# Patient Record
Sex: Male | Born: 1985
Health system: Southern US, Community
[De-identification: ages and names within clinical notes are randomized; demographics above are authoritative.]

---

## 2012-12-31 ENCOUNTER — Encounter: Payer: Self-pay | Admitting: Physician Assistant

## 2012-12-31 ENCOUNTER — Ambulatory Visit (INDEPENDENT_AMBULATORY_CARE_PROVIDER_SITE_OTHER): Payer: 59 | Admitting: Physician Assistant

## 2012-12-31 VITALS — BP 122/74 | HR 64 | Temp 97.9°F | Resp 16 | Ht 68.0 in | Wt 181.0 lb

## 2012-12-31 DIAGNOSIS — M25561 Pain in right knee: Secondary | ICD-10-CM

## 2012-12-31 DIAGNOSIS — M25569 Pain in unspecified knee: Secondary | ICD-10-CM

## 2012-12-31 NOTE — Progress Notes (Signed)
   Patient ID: Troy Glover MRN: [id number], DOB: [birth year], 27 y.o. Date of Encounter: 12/31/2012, 5:33 PM    Chief Complaint:  Chief Complaint  Patient presents with  . Knee Pain    boat injury over weekend     HPI: 27 y.o. year old male says that Friday night (12/28/12) he was sitting on a boat. He had his right leg stretched out straight in front of him. His friend fell onto his knee-his friend was standing at medial aspect of pts leg. Since then he has felt a little "something" at lateral aspect of knee. Only notices it when he goes up and down steps. Has no pain or abnormal sensation when wlking on flat surface. No locking up or giving way.  Work requires walking on flat floor-no climbing, stairs, etc. No lifting. He usually does elliptical for exercise but no other sport/activity.   Home Meds: No current outpatient prescriptions on file prior to visit.   No current facility-administered medications on file prior to visit.    Allergies: No Known Allergies    Review of Systems: See HPI for pertinent ROS  Physical Exam: Blood pressure 122/74, pulse 64, temperature 97.9 F (36.6 C), temperature source Oral, resp. rate 16, height 5\' 8"  (1.727 m), weight 181 lb (82.101 kg)., Body mass index is 27.53 kg/(m^2). General: Well developed, well nourished, in no acute distress. Neck: Supple. No thyromegaly. Full ROM. No lymphadenopathy. Lungs: Clear bilaterally to auscultation without wheezes, rales, or rhonchi. Breathing is unlabored. Heart: RRR  No murmurs, rubs, or gallops. Msk:  Strength and tone normal for age. Extremities/Skin: Right Knee: Inspection is normal with no erythema, edema, effusion. Entire knee palpated. The only area with any tenderness at all is at medial aspect-just superior to level of joint line. And it is very slightly tender. No pain or laxity with valgus/varus. Negative Anterior Drawer. Neuro: Alert and oriented X 3. Moves all extremities spontaneously. Gait  is normal. CNII-XII grossly in tact. Psych:  Responds to questions appropriately with a normal affect.   ASSESSMENT AND PLAN:  27 y.o. year old male with  1. Knee pain, right I recommend he wear a knee sleeve. Rest the knee as much as possible. Ice and elevate knee at end of day. If worsens or not resolved in 2 weeks f/u and consider ortho referral.    Signed, Frazier Richards, [place], [facility] 12/31/2012 5:33 PM

## 2013-02-14 ENCOUNTER — Ambulatory Visit (INDEPENDENT_AMBULATORY_CARE_PROVIDER_SITE_OTHER): Payer: 59 | Admitting: Physician Assistant

## 2013-02-14 ENCOUNTER — Encounter: Payer: Self-pay | Admitting: Physician Assistant

## 2013-02-14 VITALS — BP 128/74 | HR 68 | Temp 97.0°F | Resp 16 | Ht 68.5 in | Wt 181.0 lb

## 2013-02-14 DIAGNOSIS — H6590 Unspecified nonsuppurative otitis media, unspecified ear: Secondary | ICD-10-CM

## 2013-02-14 DIAGNOSIS — R599 Enlarged lymph nodes, unspecified: Secondary | ICD-10-CM

## 2013-02-14 DIAGNOSIS — H6592 Unspecified nonsuppurative otitis media, left ear: Secondary | ICD-10-CM

## 2013-02-14 MED ORDER — AMOXICILLIN-POT CLAVULANATE 875-125 MG PO TABS: 1.0000 | ORAL_TABLET | Freq: Two times a day (BID) | ORAL | Status: DC

## 2013-02-14 NOTE — Progress Notes (Signed)
Patient ID: Troy Glover MRN: [id number], DOB: [birth year], 27 y.o. Date of Encounter: 02/14/2013, 4:11 PM    Chief Complaint:  Chief Complaint  Patient presents with  . lump behind left ear    x 2 mths     HPI: 27 y.o. year old white male presents for evaluation of "mass/possible enlarged lymph node" behind left ear. First noticed it about 2 months ago. Has never been tender. Has remained about the same size. Has had no ear pain, tooth pain, sores in mouth, sores on scalp. No fever, no cats at home, and no cat scratch. Has not noticed any other enlarged lymph nodes. Feels fine o/w. No increased fatigue or malaise. No fever/chills.  Home Meds: See attached medication section for any medications that were entered at today's visit. The computer does not put those onto this list.The following list is a list of meds entered prior to today's visit.   No current outpatient prescriptions on file prior to visit.   No current facility-administered medications on file prior to visit.    Allergies: No Known Allergies    Review of Systems: See HPI for pertinent ROS. All other ROS negative.    Physical Exam: Blood pressure 128/74, pulse 68, temperature 97 F (36.1 C), temperature source Oral, resp. rate 16, height 5' 8.5" (1.74 m), weight 181 lb (82.101 kg)., Body mass index is 27.12 kg/(m^2). General: WNWD WM. Appears in no acute distress. HEENT: Normocephalic, atraumatic, eyes without discharge, sclera non-icteric, nares are without discharge. Bilateral auditory canals clear, TM's are without perforation. Right TM is pearly grey and translucent with reflective cone of light. Left TM has area of red erythema. Inferior half is dull and golden. Different appearance than the right TM, which is perfectly clear.  Oral cavity moist, posterior pharynx without exudate, erythema, peritonsillar abscess, or post nasal drip.No lesions on oral mucosa.   Neck: Supple. No thyromegaly.  LYMPH NODES: Left  PostAuricular Lymph node is approx 1 cm diameter. Firm. There is no palpable right postauricular lymph node.  Right Tonsillar node is </= to 1 cm. Left Tonsillar node is normal and not prominent.  Right Submental node is < 1 cm but is more prominent than the one on the left, which is not palpable.  Axilla: Bilateral Axilla palpated and no lymph nodes are palpable.  Groin: Bilateral Groin are palpated. No enlarged lymph nodes palpable.  Lungs: Clear bilaterally to auscultation without wheezes, rales, or rhonchi. Breathing is unlabored. Heart: Regular rhythm. No murmurs, rubs, or gallops. Msk:  Strength and tone normal for age. Neuro: Alert and oriented X 3. Moves all extremities spontaneously. Gait is normal. CNII-XII grossly in tact. Psych:  Responds to questions appropriately with a normal affect.     ASSESSMENT AND PLAN:  27 y.o. year old male with  1. Enlarged lymph node - amoxicillin-clavulanate (AUGMENTIN) 875-125 MG per tablet; Take 1 tablet by mouth 2 (two) times daily.  Dispense: 20 tablet; Refill: 0 - CBC with Differential - Sedimentation rate  2. Left otitis media with effusion - amoxicillin-clavulanate (AUGMENTIN) 875-125 MG per tablet; Take 1 tablet by mouth 2 (two) times daily.  Dispense: 20 tablet; Refill: 0  I really think the enlarge lymph node is secondary to ear infection. However, he wants to go ahead and have blood work while he is here. Will f/u lab results. Complete all of abx. If lymph node size does not decrease by completion of antibiotics, then f/u.   Signed, Shon Hale [place], [place], [place]  02/14/2013 4:11 PM

## 2013-02-15 LAB — CBC WITH DIFFERENTIAL/PLATELET
Basophils Relative: 0 % (ref 0–1)
Eosinophils Absolute: 0.1 10*3/uL (ref 0.0–0.7)
Eosinophils Relative: 2 % (ref 0–5)
HCT: 40.4 % (ref 39.0–52.0)
Hemoglobin: 13.9 g/dL (ref 13.0–17.0)
MCH: 28.8 pg (ref 26.0–34.0)
MCHC: 34.4 g/dL (ref 30.0–36.0)
MCV: 83.8 fL (ref 78.0–100.0)
Monocytes Absolute: 0.4 10*3/uL (ref 0.1–1.0)
Monocytes Relative: 7 % (ref 3–12)

## 2013-03-04 ENCOUNTER — Telehealth: Payer: Self-pay | Admitting: Physician Assistant

## 2013-03-04 NOTE — Telephone Encounter (Signed)
Patient was called and appt made for Wednesday.

## 2013-03-04 NOTE — Telephone Encounter (Signed)
Having not felt the mass, I would like to see him prior to ordering an [place].

## 2013-03-06 ENCOUNTER — Encounter: Payer: Self-pay | Admitting: Family Medicine

## 2013-03-06 ENCOUNTER — Ambulatory Visit (INDEPENDENT_AMBULATORY_CARE_PROVIDER_SITE_OTHER): Payer: 59 | Admitting: Family Medicine

## 2013-03-06 VITALS — BP 110/72 | HR 70 | Temp 98.1°F | Resp 18 | Ht 69.0 in | Wt 182.0 lb

## 2013-03-06 DIAGNOSIS — R599 Enlarged lymph nodes, unspecified: Secondary | ICD-10-CM

## 2013-03-06 NOTE — Assessment & Plan Note (Signed)
Enlarged node, not responsive to antibiotics , obtain Ultrasound Other differentials cyst, bony growth

## 2013-03-06 NOTE — Progress Notes (Signed)
  Subjective:    Patient ID: Troy Glover, male    DOB: 11/10/1985, 27 y.o.   MRN: [id number]  HPI  Pt here to f/u left sided neck mass. Seen in May prescribed antibiotics, for enlarged lymph node, took 10 days Amox, no improvement. Node is non tender, pt this it may be a little bigger in size.  No family history of lymphoma or other neck mass/cancer Review of Systems - per above  GEN- denies fatigue, fever, weight loss,weakness, recent illness HEENT- denies eye drainage, change in vision, nasal discharge, Neuro- denies headache, dizziness, syncope, seizure activity       Objective:   Physical Exam   GEN- NAD, alert and oriented x3 HEENT- PERRL, EOMI, non injected sclera, pink conjunctiva, MMM, oropharynx clear, TM clear bilat no effusion, no maxillary sinus tenderness,  Neck- Supple, Left post auricular 1cm node firm, non mobile, non fluctuant, shotty submandibular node, no post auricular node right side          Assessment & Plan:

## 2013-03-06 NOTE — Patient Instructions (Signed)
Ultrasound to be done No further meds needed

## 2013-03-07 ENCOUNTER — Encounter: Payer: Self-pay | Admitting: Family Medicine

## 2013-03-11 ENCOUNTER — Ambulatory Visit
Admission: RE | Admit: 2013-03-11 | Discharge: 2013-03-11 | Disposition: A | Discharge status: Home / Self Care | Payer: 59 | Source: Ambulatory Visit | Attending: Family Medicine | Admitting: Family Medicine

## 2013-03-11 DIAGNOSIS — R599 Enlarged lymph nodes, unspecified: Secondary | ICD-10-CM

## 2013-03-12 ENCOUNTER — Other Ambulatory Visit: Payer: Self-pay | Admitting: Family Medicine

## 2013-03-12 DIAGNOSIS — R590 Localized enlarged lymph nodes: Secondary | ICD-10-CM

## 2013-05-17 ENCOUNTER — Telehealth: Payer: Self-pay | Admitting: Family Medicine

## 2013-05-17 NOTE — Telephone Encounter (Signed)
Pt C/O persistent cough x 4 weeks.  Worse in the afternoon.  What would we recommend??  Recommend office visit with provider.  Gave patient appt for Tuesday Morning.  Stated he had old antibiotics at home should he take them??  NO NO NO.  Do Not take them.

## 2013-05-21 ENCOUNTER — Ambulatory Visit: Payer: Self-pay | Admitting: Family Medicine

## 2013-06-03 ENCOUNTER — Ambulatory Visit (INDEPENDENT_AMBULATORY_CARE_PROVIDER_SITE_OTHER): Payer: 59 | Admitting: Family Medicine

## 2013-06-03 DIAGNOSIS — Z23 Encounter for immunization: Secondary | ICD-10-CM

## 2014-12-02 ENCOUNTER — Ambulatory Visit (INDEPENDENT_AMBULATORY_CARE_PROVIDER_SITE_OTHER): Payer: 59 | Admitting: Family Medicine

## 2014-12-02 ENCOUNTER — Encounter: Payer: Self-pay | Admitting: Family Medicine

## 2014-12-02 VITALS — BP 126/74 | HR 66 | Temp 97.4°F | Resp 16 | Ht 69.0 in | Wt 181.0 lb

## 2014-12-02 DIAGNOSIS — R091 Pleurisy: Secondary | ICD-10-CM

## 2014-12-02 NOTE — Progress Notes (Signed)
   Subjective:    Patient ID: Troy Glover, male    DOB: 08/03/1986, 29 y.o.   MRN: [id number]  HPI  Patient symptoms began in January. He developed right-sided pleurisy in his anterior right lung around the fifth rib just below his right nipple. That area is still tender to palpation. It hurts with deep breaths. He denies any night sweats fever or hemoptysis. It also hurts when he stretches over his head. He denies any falls or injuries or inciting events. He does smoke. He denies any weight loss. He denies any other systemic symptoms. He denies any exposure to tuberculosis. No past medical history on file. No past surgical history on file. No current outpatient prescriptions on file prior to visit.   No current facility-administered medications on file prior to visit.   No Known Allergies History   Social History  . Marital Status: Unknown    Spouse Name: N/A  . Number of Children: N/A  . Years of Education: N/A   Occupational History  . Not on file.   Social History Main Topics  . Smoking status: Current Every Day Smoker    Types: Cigarettes  . Smokeless tobacco: Never Used  . Alcohol Use: Yes  . Drug Use: No  . Sexual Activity: Not on file   Other Topics Concern  . Not on file   Social History Narrative     Review of Systems  All other systems reviewed and are negative.      Objective:   Physical Exam  Constitutional: He appears well-developed and well-nourished.  Neck: Neck supple.  Cardiovascular: Normal rate, regular rhythm and normal heart sounds.   Pulmonary/Chest: Effort normal and breath sounds normal. No respiratory distress. He has no wheezes. He has no rales. He exhibits tenderness.  Abdominal: Soft. Bowel sounds are normal.  Lymphadenopathy:    He has no cervical adenopathy.  Vitals reviewed.         Assessment & Plan:  Pleurisy - Plan: DG Chest 2 View, DG Ribs Unilateral Right  I believe the patient has an intercostal muscle strain. That  is why the patient's pain is pleuritic in nature. It also is why it is tender with stretching of his rib cage. There are no abdominal symptoms whatsoever. He has no worrisome findings. I will obtain a chest x-ray as well as x-rays of the ribs to rule out significant underlying pathology and if negative, I will recommend tincture of time and reassured the patient that this will gradually improve over the next 1-2 months.

## 2014-12-04 ENCOUNTER — Ambulatory Visit
Admission: RE | Admit: 2014-12-04 | Discharge: 2014-12-04 | Disposition: A | Discharge status: Home / Self Care | Payer: Self-pay | Source: Ambulatory Visit | Attending: Family Medicine | Admitting: Family Medicine

## 2014-12-04 DIAGNOSIS — R091 Pleurisy: Secondary | ICD-10-CM

## 2016-02-03 ENCOUNTER — Ambulatory Visit (INDEPENDENT_AMBULATORY_CARE_PROVIDER_SITE_OTHER): Payer: 59 | Admitting: Family Medicine

## 2016-02-03 ENCOUNTER — Encounter: Payer: Self-pay | Admitting: Family Medicine

## 2016-02-03 VITALS — BP 128/72 | HR 78 | Temp 98.5°F | Resp 12 | Ht 69.0 in | Wt 183.0 lb

## 2016-02-03 DIAGNOSIS — G5621 Lesion of ulnar nerve, right upper limb: Secondary | ICD-10-CM | POA: Diagnosis not present

## 2016-02-03 MED ORDER — MELOXICAM 7.5 MG PO TABS: 7.5000 mg | ORAL_TABLET | Freq: Every day | ORAL | Status: DC

## 2016-02-03 NOTE — Patient Instructions (Addendum)
Take the mobic once a day for 2 weeks  Use the brace  Call if no improvement with brace in 4 weeks and ortho referral will be made F/U as needed

## 2016-02-03 NOTE — Progress Notes (Signed)
Patient ID: Troy Glover, male   DOB: 08/03/1986, 30 y.o.   MRN: [id number]   Subjective:    Patient ID: Troy Glover, male    DOB: 06/28/1986, 30 y.o.   MRN: [id number]  Patient presents for R Hand Pain Patient here with right hand pain for the past couple months this has been worsening. He notices discomfort as well as a change in sensation on his fourth and fifth digits when he is using his computer.His fingers will actually feel cold and he keeps a heater near by.  He occasionally gets a numb sensation as well. On occasion he has some pain at his elbow but typically is mostly at his wrist and into his hand. He also feels like his fingers a little bit weaker on the fourth and fifth digits. He has not tried anything over-the-counter for any discomfort.    Review Of Systems:  GEN- denies fatigue, fever, weight loss,weakness, recent illness HEENT- denies eye drainage, change in vision, nasal discharge, CVS- denies chest pain, palpitations RESP- denies SOB, cough, wheeze ABD- denies N/V, change in stools, abd pain GU- denies dysuria, hematuria, dribbling, incontinence MSK- denies joint pain, muscle aches, injury Neuro- denies headache, dizziness, syncope, seizure activity       Objective:    BP 128/72 mmHg  Pulse 78  Temp(Src) 98.5 F (36.9 C) (Oral)  Resp 12  Ht 5\' 9"  (1.753 m)  Wt 183 lb (83.008 kg)  BMI 27.01 kg/m2 GEN- NAD, alert and oriented x3 Neck- supple, FROM NEURO-CNII-XII intact, strength equal bilat UE, neg tinels, normal grasp bilat Decrease sensation over lateral aspect of right fith digit and palmer surface of 4th and 5th digits, FROM of digits MSK NT at elbow/epicondyles, FROM Wrist, shoulder        Assessment & Plan:      Problem List Items Addressed This Visit    None    Visit Diagnoses    Ulnar neuropathy at wrist, right    -  Primary    Wrist brace given to help relieve nerve with working, activites, given Mobic for 2 weeks. if no improvement in 4  weeks with bracing ortho referral       Note: This dictation was prepared with Dragon dictation along with smaller phrase technology. Any transcriptional errors that result from this process are unintentional.

## 2016-03-15 IMAGING — CR DG RIBS 2V*R*
3 series · 3 of 3 positions shown · non-contrast
Comparison: None.

CLINICAL DATA: Right pleura.  no injury.

EXAM:
RIGHT RIBS - 2 VIEW

[w ribs ap/pa upper right *]
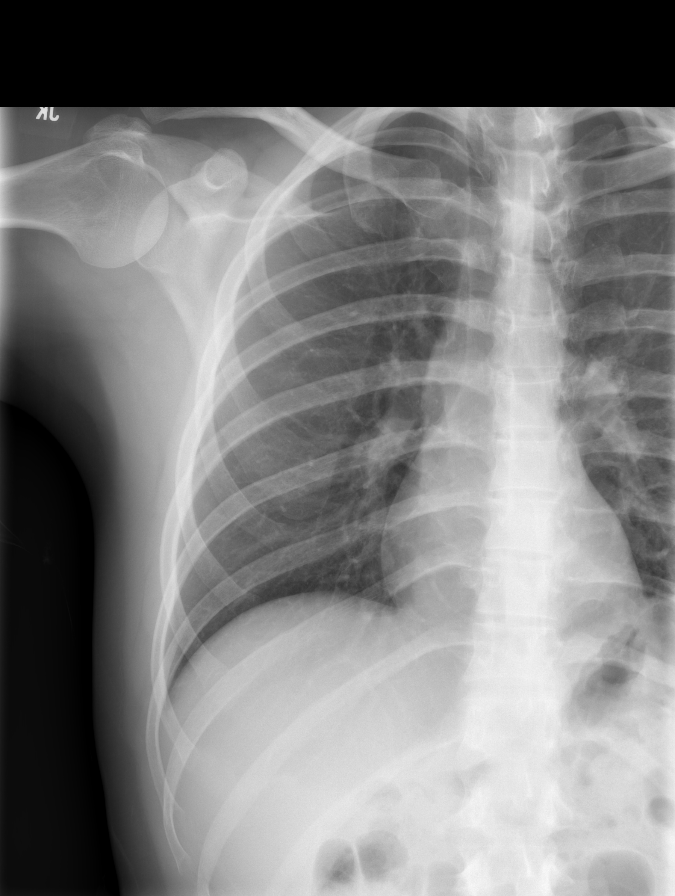

[w ribs oblique right *]
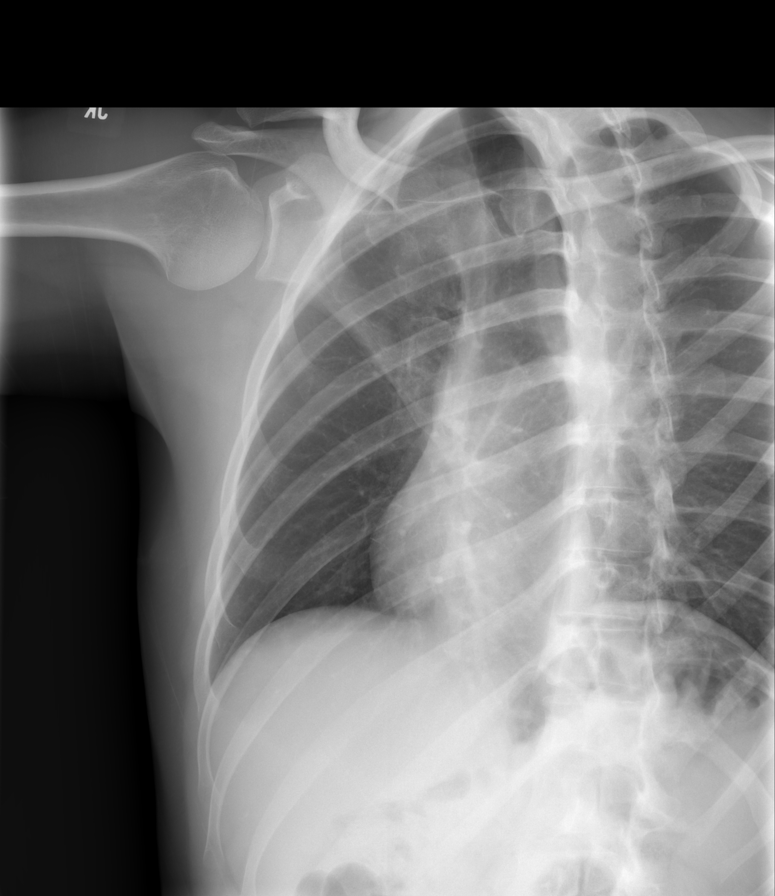

[w ribs ap/pa lower right *]
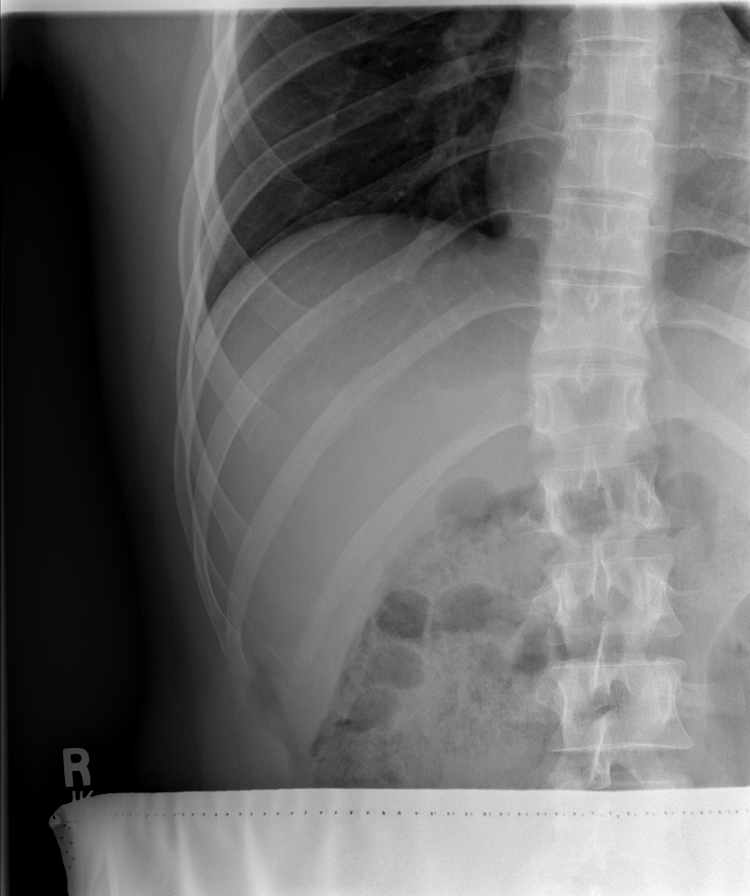

[3 of 3 positions shown; findings below may reference images not displayed]

FINDINGS: No fracture or other bone lesions are seen involving the ribs.
IMPRESSION: Negative.

## 2016-03-15 IMAGING — CR DG CHEST 2V
2 series · 2 of 2 positions shown · non-contrast
Comparison: None.

CLINICAL DATA: Right pleurisy

EXAM:
CHEST  2 VIEW

[w chest pa]
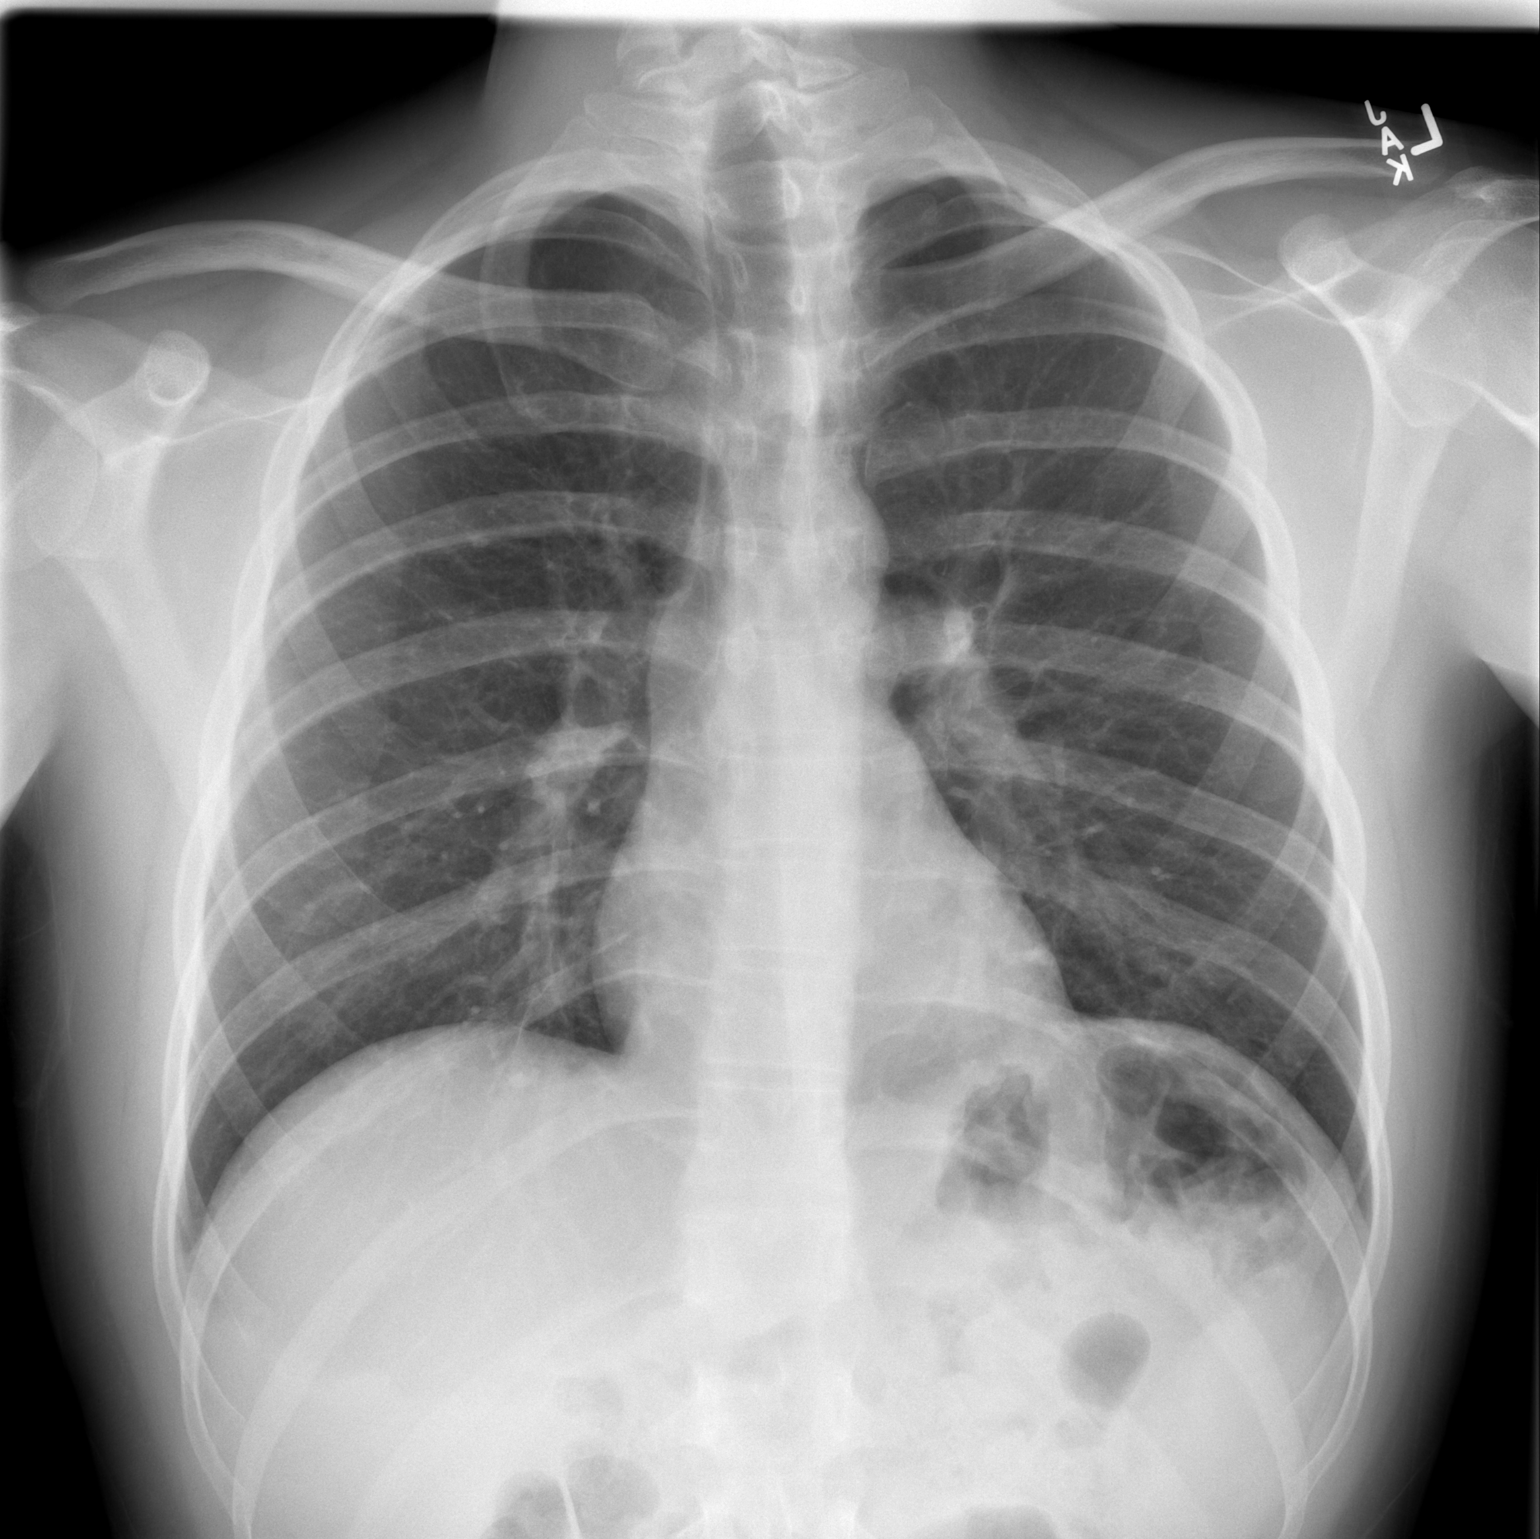

[w chest lat]
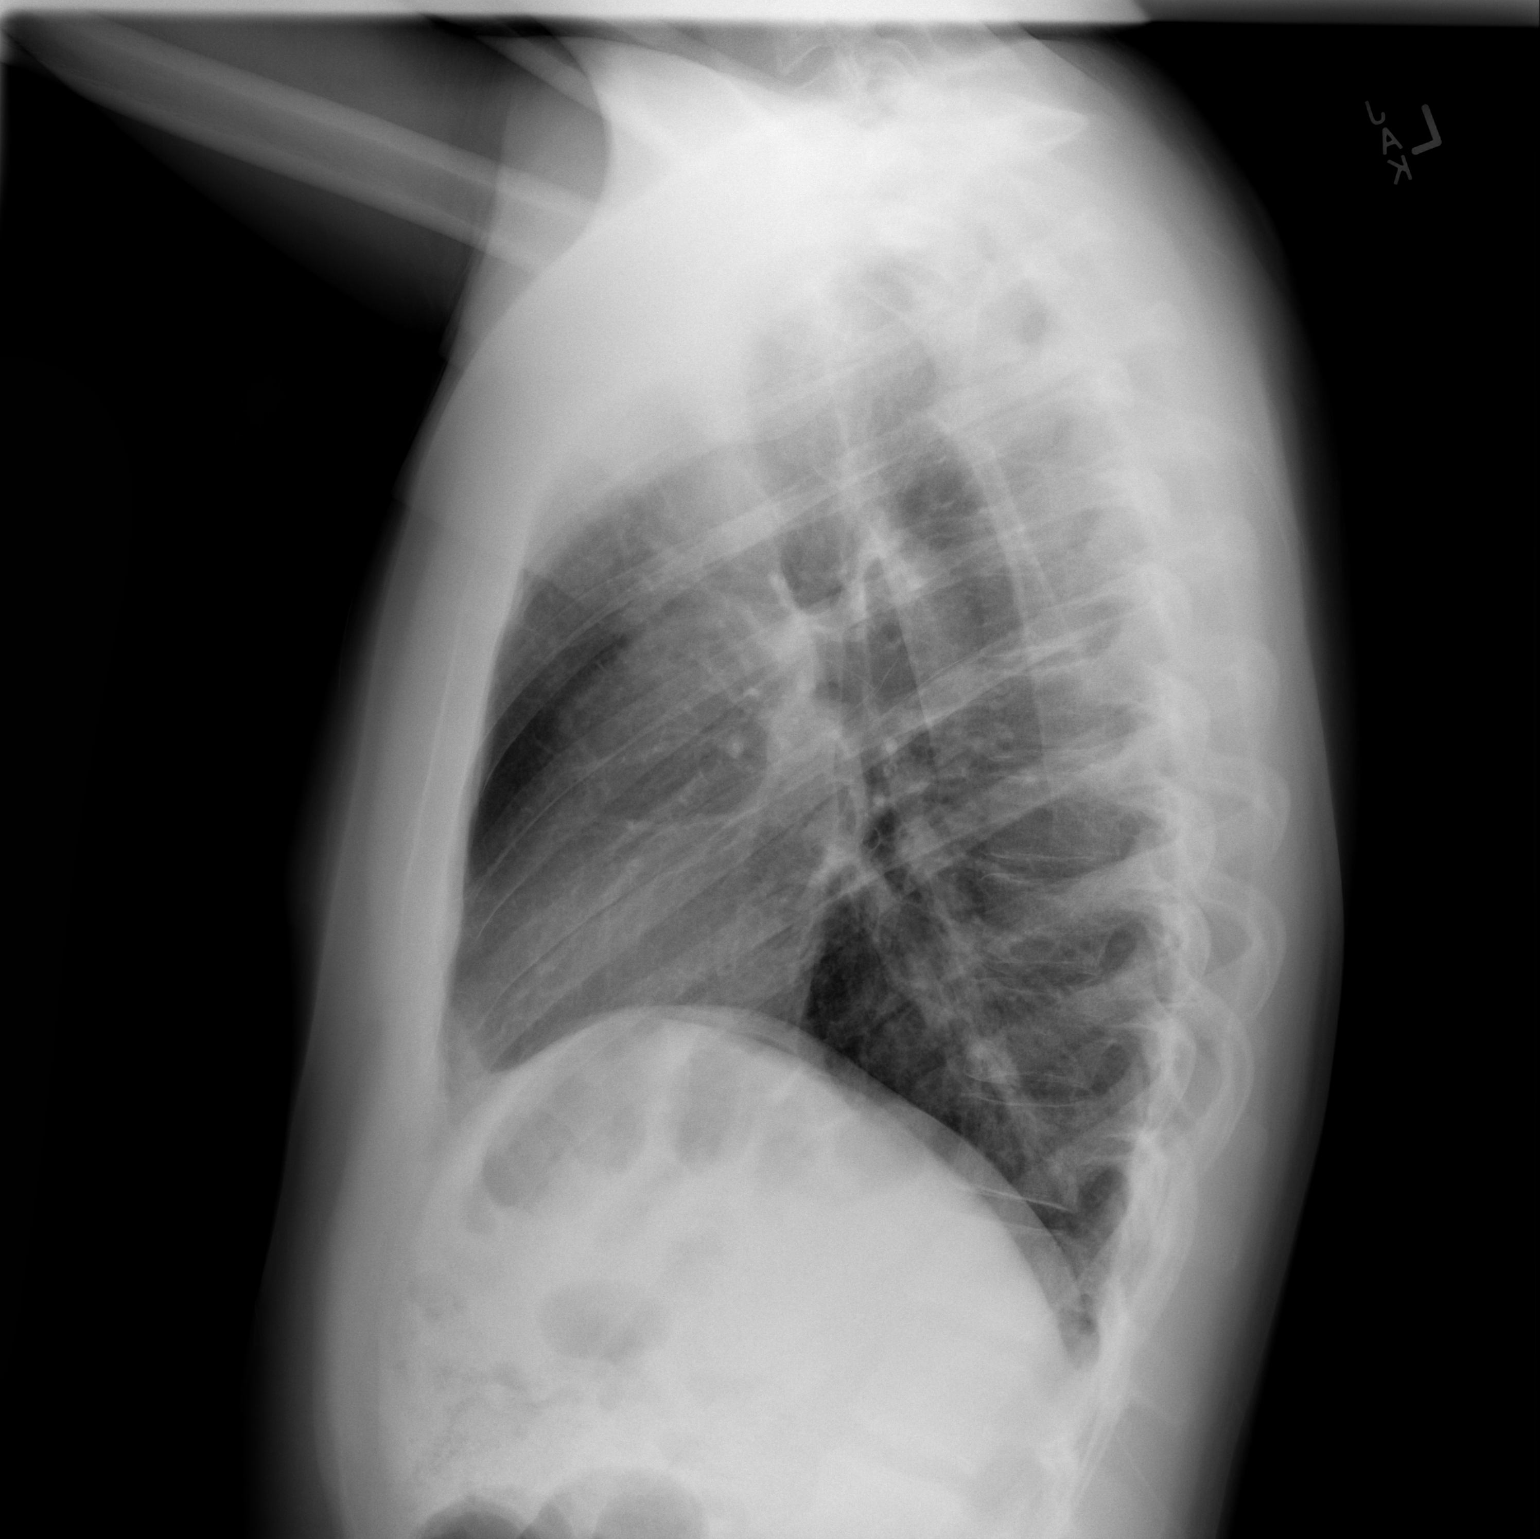

[2 of 2 positions shown; findings below may reference images not displayed]

FINDINGS: The heart size and mediastinal contours are within normal limits.
Both lungs are clear. The visualized skeletal structures are
unremarkable.
IMPRESSION: No active cardiopulmonary disease.

## 2016-12-05 ENCOUNTER — Ambulatory Visit (INDEPENDENT_AMBULATORY_CARE_PROVIDER_SITE_OTHER): Payer: 59 | Admitting: Family Medicine

## 2016-12-05 ENCOUNTER — Encounter: Payer: Self-pay | Admitting: Family Medicine

## 2016-12-05 VITALS — BP 110/68 | HR 74 | Temp 98.0°F | Resp 16 | Ht 69.0 in | Wt 180.0 lb

## 2016-12-05 DIAGNOSIS — R05 Cough: Secondary | ICD-10-CM | POA: Diagnosis not present

## 2016-12-05 DIAGNOSIS — R059 Cough, unspecified: Secondary | ICD-10-CM

## 2016-12-05 DIAGNOSIS — J40 Bronchitis, not specified as acute or chronic: Secondary | ICD-10-CM

## 2016-12-05 MED ORDER — AZITHROMYCIN 250 MG PO TABS: ORAL_TABLET | ORAL | 0 refills | Status: DC

## 2016-12-05 MED ORDER — HYDROCODONE-HOMATROPINE 5-1.5 MG/5ML PO SYRP: 5.0000 mL | ORAL_SOLUTION | Freq: Three times a day (TID) | ORAL | 0 refills | Status: DC | PRN

## 2016-12-05 NOTE — Progress Notes (Signed)
   Subjective:    Patient ID: Troy Glover, male    DOB: 08/31/1986, 31 y.o.   MRN: [id number]  HPI 2 weeks ago, the patient developed an upper respiratory infection with rhinorrhea and head congestion which he treated symptomatically using Mucinex and Sudafed. The cold gradually got better. However over the last 3 days he has had a sudden worsening bruise developed a persistent hacking cough that will not stop. In fact he is coughing so hard he felt he was going pass out at times. He denies any hemoptysis. He denies any chest pain. He denies any fever. He does report some mild pleurisy due to coughing. He cannot stop coughing during our encounter No past medical history on file. No past surgical history on file. No current outpatient prescriptions on file prior to visit.   No current facility-administered medications on file prior to visit.    No Known Allergies Social History   Social History  . Marital status: Unknown    Spouse name: N/A  . Number of children: N/A  . Years of education: N/A   Occupational History  . Not on file.   Social History Main Topics  . Smoking status: Current Every Day Smoker    Types: Cigarettes  . Smokeless tobacco: Never Used  . Alcohol use Yes  . Drug use: No  . Sexual activity: Not on file   Other Topics Concern  . Not on file   Social History Narrative  . No narrative on file      Review of Systems  All other systems reviewed and are negative.      Objective:   Physical Exam  Constitutional: He appears well-developed and well-nourished. No distress.  HENT:  Right Ear: External ear normal.  Left Ear: External ear normal.  Nose: Nose normal.  Mouth/Throat: Oropharynx is clear and moist. No oropharyngeal exudate.  Neck: Neck supple.  Cardiovascular: Normal rate, regular rhythm and normal heart sounds.   No murmur heard. Pulmonary/Chest: Effort normal and breath sounds normal. No respiratory distress. He has no wheezes. He has no  rales. He exhibits no tenderness.  Lymphadenopathy:    He has no cervical adenopathy.  Skin: He is not diaphoretic.  Vitals reviewed.         Assessment & Plan:  Cough - Plan: DG Chest 2 View  Bronchitis - Plan: azithromycin (ZITHROMAX) 250 MG tablet, HYDROcodone-homatropine (HYCODAN) 5-1.5 MG/5ML syrup  I believe the patient had a viral upper respiratory infection may have developed a secondary bronchitis. I will prescribe the patient a Z-Pak. Use Hycodan 1 teaspoon every 6 hours as needed for cough. Proceed with chest x-ray if cough is not better after 1 week or sooner if worse

## 2017-04-27 ENCOUNTER — Telehealth: Payer: Self-pay | Admitting: Physician Assistant

## 2017-04-27 ENCOUNTER — Encounter: Payer: Self-pay | Admitting: Physician Assistant

## 2017-04-27 NOTE — Telephone Encounter (Signed)
PLS see note below and advise

## 2017-04-27 NOTE — Telephone Encounter (Signed)
Yes I will complete FMLA paperwork. He does not have to schedule OV. Tell him that I am very sorry this has happened and am thinking of them.

## 2017-04-27 NOTE — Telephone Encounter (Signed)
Pt called in stating that his wife Troy Glover just lost their baby she was [redacted] weeks pregnant and needs to know if we can fill out FMLA forms for him. The will be out of work for the next 30 days for grievance and to be with her. He doesn't mind coming in for an appointment if he has to but his work states that it has to be filled out by his PCP office. From what I can see it looks like [facility] is listed as his pcp. Please let me know if I need to schedule him an appointment.

## 2017-04-28 NOTE — Telephone Encounter (Signed)
Spoke with Troy Glover and he is aware that we will fill out forms., patient states he will bring forms in on 8/13 and that the company that handles their disabliity forms by the name of [employer] will be calling our office to gather information

## 2017-05-01 ENCOUNTER — Ambulatory Visit (INDEPENDENT_AMBULATORY_CARE_PROVIDER_SITE_OTHER): Payer: 59 | Admitting: Physician Assistant

## 2017-05-01 ENCOUNTER — Encounter: Payer: Self-pay | Admitting: Physician Assistant

## 2017-05-01 VITALS — BP 110/78 | HR 84 | Temp 97.8°F | Resp 16 | Ht 69.0 in | Wt 184.0 lb

## 2017-05-01 DIAGNOSIS — Z0289 Encounter for other administrative examinations: Secondary | ICD-10-CM

## 2017-05-01 NOTE — Progress Notes (Addendum)
Patient ID: Troy Glover MRN: [id number], DOB: 04/19/1986, 31 y.o. Date of Encounter: 05/01/2017, 12:42 PM    Chief Complaint:  Chief Complaint  Patient presents with  . discuss fmal papers     HPI: 31 y.o. year old male here to discuss need to be out of work.   He had called last week regarding needing FMLA paperwork completed and I had agreed to do this without requiring him to come in for office visit. However, today he comes in to discuss with me further. Also explained that after review of policies for FMLA versus disability decided that the disability was more appropriate for his needs.  It is felt that he needs to be out of work for 30 days. It is felt that he is unable to do work of any kind during this time.  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 His job requires a lot of decisions and disciplinary action and career changes. A lot of stress in making decisions. Does not feel that he is emotionally and mentally able to do this job at this time.  His last day that he worked was 04/26/17. First day of absence from work with 04/27/17. Plan for him to remain out of work through 05/28/17 and return to work 05/29/17.  30 minutes of time was spent in direct conversation with patient.   ADDENDUM ADDED 05/11/2017: When [name] was here for his visit with me on 05/01/17, we discussed that sometimes, secondary to HIPPA and regulations, employers do not have to have subjective information. Therefore, at that visit encounter I did not document all of the information that was disclosed at the time of that visit. However, after his visit on 05/01/17, I received fax from his employer/disability plan stating that they need additional objective documentation. They also sent an attached behavioral health assessment form that I have reviewed, but that form does not apply to him and his situation. Therefore I just called and spoke  with patient on the telephone and asked if it is okay if I go ahead and document the information that we discussed at his visit. He reports that it is okay for me to proceed with documenting the information reviewed and discussed at his visit 05/01/17.  He and his wife have no prior children. His wife is now 31 years old. At time of evaluation on 04/26/2017 she was documented as [redacted] weeks gestation. On 04/26/2017 she presented to maternity admissions with severe abdominal pain. Upon evaluation they were unable to locate fetal heart tone. Bedside ultrasound and assessment was suspicious for placental abruption and intrauterine fetal demise. Emergency C-section was performed. Despite vigorous resuscitation efforts baby could not be revived. Other notes document that it was a preterm infant that appeared lifeless at birth with no heart rate. No heart rate or other response noted despite efforts and resuscitation was discontinued after 15 minutes. Infant's father was informed of this unsuccessful resuscitation attempt.  At his visit with me on 05/01/2017 he reported that his wife's parents were there at the house with them now,  providing support. At that visit he reported that he had been trying to be there to prevent his wife from having to deal with anything that she did not absolutely have to. He reported that they were planning the baby's cremation. He did report that the hospital had talked to them about support grief sessions and they will do 10  of these.  At his visit with me he was concerned that if he returned to work right now, that he would not have taken the time to deal with his emotions etc. and then may not handle things at work appropriately.  As well, he then would not be supplying appropriate support to his wife.  He informed me that he has many people who directly report to him and he has a lot of decisions regarding disciplinary action and career changes.  Reported that these decisions can be  very stressful and he does not feel comfortable making high stress decisions at this time.      Home Meds:   Outpatient Medications Prior to Visit  Medication Sig Dispense Refill  . azithromycin (ZITHROMAX) 250 MG tablet 2 tabs poqday1, 1 tab poqday 2-5 6 tablet 0  . HYDROcodone-homatropine (HYCODAN) 5-1.5 MG/5ML syrup Take 5 mLs by mouth every 8 (eight) hours as needed for cough. 120 mL 0   No facility-administered medications prior to visit.     Allergies: No Known Allergies    Review of Systems: See HPI for pertinent ROS. All other ROS negative.    Physical Exam: Blood pressure 110/78, pulse 84, temperature 97.8 F (36.6 C), temperature source Oral, resp. rate 16, height 5\' 9"  (1.753 m), weight 184 lb (83.5 kg), SpO2 99 %., Body mass index is 27.17 kg/m. General:  WNWD WM. Appears in no acute distress. Neck: Supple. No thyromegaly. No lymphadenopathy. Lungs: Clear bilaterally to auscultation without wheezes, rales, or rhonchi. Breathing is unlabored. Heart: Regular rhythm. No murmurs, rubs, or gallops. Msk:  Strength and tone normal for age. Extremities/Skin: Warm and dry.  Neuro: Alert and oriented X 3. Moves all extremities spontaneously. Gait is normal. CNII-XII grossly in tact. Psych:  Responds to questions appropriately with a normal affect.     ASSESSMENT AND PLAN:  31 y.o. year old male with   1. Encounter to obtain excuse from work His last day that he worked was 04/26/17. First day of absence from work with 04/27/17. Plan for him to remain out of work through 05/28/17 and return to work 05/29/17.    [address] [place], [place], [facility] 05/01/2017 12:42 PM

## 2017-05-05 ENCOUNTER — Telehealth: Payer: Self-pay | Admitting: Physician Assistant

## 2017-05-05 NOTE — Telephone Encounter (Signed)
lvm for patient the forms have been completed and faxed back to [employer]

## 2017-05-05 NOTE — Telephone Encounter (Signed)
New Message  Pt voiced wanting to know if his FMLA paperwork has been complete and pt also sent something with his signature on it.  Please f/u

## 2018-05-08 ENCOUNTER — Encounter: Payer: Self-pay | Admitting: Family Medicine

## 2018-05-08 ENCOUNTER — Ambulatory Visit: Payer: 59 | Admitting: Family Medicine

## 2018-05-08 VITALS — BP 120/70 | HR 71 | Temp 97.9°F | Resp 16 | Wt 190.2 lb

## 2018-05-08 DIAGNOSIS — S39012A Strain of muscle, fascia and tendon of lower back, initial encounter: Secondary | ICD-10-CM | POA: Diagnosis not present

## 2018-05-08 MED ORDER — CYCLOBENZAPRINE HCL 10 MG PO TABS: 10.0000 mg | ORAL_TABLET | Freq: Three times a day (TID) | ORAL | 0 refills | Status: AC | PRN

## 2018-05-08 MED ORDER — PREDNISONE 20 MG PO TABS: 40.0000 mg | ORAL_TABLET | Freq: Every day | ORAL | 0 refills | Status: AC

## 2018-05-08 NOTE — Patient Instructions (Addendum)
Use over the counter NSAIDS - like ibuprofen, aleve, naproxen and tylenol for pain.  Use a heating pad frequently, avoid immobilization.  Do not lift anything below waist weighing more than 10 lbs for the next 1-2 weeks.  Use muscle relaxers for when stiff, tight or having muscle spasms.  Hold and only fill steroids (prednisone) for if you develop worsening and daily back pain radiating down leg with tingling, burning or electrical pain.  If you have any numbness, incontinence of stool or urine, or weakness in legs - go to the ER for evaluation.  Physical therapy referral to help eval and rehab.  Follow up in 1 month   May need imaging or ortho/spine referral if not feeling much better in 1-2 months  Mid-Back Strain A strain is a stretch or tear in a muscle or the strong cords of tissue that attach muscle to bone (tendons). Strains of the mid-back (thoracic spine) occur between the neck and the lower back. A strain occurs when muscles or tendons are torn or are stretched beyond their limits. The muscles may become inflamed, resulting in pain and sudden muscle tightening (spasms). A strain can happen suddenly due to an injury (trauma), or it can develop gradually due to overuse. There are three types of strains:  Grade 1 is a mild strain involving a minor tear of the muscle fibers or tendons. This may cause some pain but no loss of muscle strength.  Grade 2 is a moderate strain involving a partial tear of the muscle fibers or tendons. This causes more severe pain and some loss of muscle strength.  Grade 3 is a severe strain involving a complete tear of the muscle or tendon. This causes severe pain and complete or nearly complete loss of muscle strength.  What are the causes? This condition may be caused by:  Trauma, such as a fall or a hit to the body.  Twisting or overstretching the back. This may result from doing activities that require a lot of energy, such as lifting heavy  objects.  What increases the risk? The following factors may increase your risk of getting this condition:  Playing contact sports.  Playing sports that put stress on the middle back, including: ? Gymnastics. ? Rowing. ? Golf. ? Horseback riding.  What are the signs or symptoms? Symptoms of this condition may include:  Sharp, dull, or spreading pain in the middle back that does not go away.  Stiffness.  Limited range of motion.  Inability to stand up straight due to stiffness or pain.  Muscle spasms.  How is this diagnosed?  This condition may be diagnosed based on:  Your symptoms.  Your medical history.  A physical exam. ? Your health care provider may push on certain areas of your back to determine the source of your pain. ? You may be asked to bend forward, backward, and side to side to assess the severity of your pain and your range of motion.  Imaging tests, such as: ? X-rays. ? MRI.  How is this treated? Treatment for this condition may include:  Applying heat and cold to the affected area.  Medicines to help relieve pain and to relax your muscles (muscle relaxants).  NSAIDs to help reduce swelling and discomfort.  Physical therapy.  When your symptoms improve, it is important to gradually return to your normal routine as soon as possible to reduce pain, avoid stiffness, and avoid loss of muscle strength. Generally, symptoms should improve within 6 weeks  of treatment. However, recovery time varies. Follow these instructions at home: Managing pain, stiffness, and swelling  If directed, apply ice to the injured area during the first 24 hours after your injury. ? Put ice in a plastic bag. ? Place a towel between your skin and the bag. ? Leave the ice on for 20 minutes, 2-3 times a day.  If directed, apply heat to the affected area as often as told by your health care provider. Use the heat source that your health care provider recommends, such as a  moist heat pack or a heating pad. ? Place a towel between your skin and the heat source. ? Leave the heat on for 20-30 minutes. ? Remove the heat if your skin turns bright red. This is especially important if you are unable to feel pain, heat, or cold. You may have a greater risk of getting burned. Activity  Rest and return to your normal activities as told by your health care provider. Ask your health care provider what activities are safe for you.  Avoid activities that take a lot of effort (are strenuous) for as long as told by your health care provider.  Do exercises as told by your health care provider. General instructions   Take over-the-counter and prescription medicines only as told by your health care provider.  If you have questions or concerns about safety while taking pain medicine, talk with your health care provider.  Do not drive or operate heavy machinery until you know how your pain medicine affects you.  Do not use any tobacco products, such as cigarettes, chewing tobacco, and e-cigarettes. Tobacco can delay bone healing. If you need help quitting, ask your health care provider.  Keep all follow-up visits as told by your health care provider. This is important. How is this prevented?  Warm up and stretch before being active.  Cool down and stretch after being active.  Give your body time to rest between periods of activity.  Avoid: ? Being physically inactive for long periods at a time. ? Exercising or playing sports when you are tired or in pain.  Use correct form when playing sports and lifting heavy objects.  Use good posture when sitting and standing.  Maintain a healthy weight.  Sleep on a mattress with medium firmness to support your back.  Make sure to use equipment that fits you, including shoes that fit well.  Be safe and responsible while being active to avoid falls.  Do at least 150 minutes of moderate-intensity exercise each week, such as  brisk walking or water aerobics. Try a form of exercise that takes stress off your back, such as swimming or stationary cycling.  Maintain physical fitness, including: ? Strength. ? Flexibility. ? Cardiovascular fitness. ? Endurance. Contact a health care provider if:  Your back pain does not improve after 6 weeks of treatment.  Your symptoms get worse. Get help right away if:  Your back pain is severe.  You are unable to stand or walk.  You develop pain in your legs.  You develop weakness in your buttocks or legs.  You have difficulty controlling when you urinate or when you have a bowel movement. This information is not intended to replace advice given to you by your health care provider. Make sure you discuss any questions you have with your health care provider. Document Released: 09/05/2005 Document Revised: 05/12/2016 Document Reviewed: 06/17/2015 Elsevier Interactive Patient Education  [employer]2018 [employer]

## 2018-05-08 NOTE — Progress Notes (Signed)
Patient ID: Troy Glover, male    DOB: 08/26/1986, 32 y.o.   MRN: [id number]  PCP: Troy Glover, Troy B, PA-C  Chief Complaint  Patient presents with  . Back Pain    Patient has tried Aspirin    Subjective:   Troy Glover is a 32 y.o. male, presents to clinic with CC of gradually worsening central mid to low back pain onset 6 days ago while doing 2nd set of deadlifts about 190 lbs.  Pain gradually worsened during 2nd set and he could not later proceed with 3rd set but did other exercises.   Back Pain  This is a new problem. The current episode started in the past 7 days. The problem occurs intermittently. The problem has been gradually worsening since onset. The pain is present in the thoracic spine and lumbar spine. The quality of the pain is described as aching. The pain radiates to the right thigh. The pain is mild. Worse during: worse with movement and with going from sitting to standing or worse with any period of immobility. The symptoms are aggravated by bending and standing. Stiffness is present in the morning. Pertinent negatives include no abdominal pain, bladder incontinence, bowel incontinence, dysuria, fever, headaches, numbness, paresis, paresthesias, pelvic pain, perianal numbness, tingling, weakness or weight loss. Risk factors: no hx of CA, osteoporosis, chronic steroid use, past injury/strain. Treatments tried: ASA. The treatment provided no relief.  Pain initially intermittent and mild, but gradually worsening x 7 days despite rest and ASA, now pain mild to moderate, can feel in low thoracic and high lumbar spine, midline, when completely rested and relaxed, pain exacerbated with getting up, bending forward and with getting out of bed first thing in the morning.  Most severely painful is going from sitting to standing.  Pain has some radiation down the back of his right leg, entire leg intermittently, not associated with any particular movement, radiating leg pain also achy, only  occurred today.  Patient Active Problem List   Diagnosis Date Noted  . Enlarged lymph node 03/06/2013     Prior to Admission medications   Not on File     No Known Allergies   History reviewed. No pertinent family history.   Social History   Socioeconomic History  . Marital status: Unknown    Spouse name: Not on file  . Number of children: Not on file  . Years of education: Not on file  . Highest education level: Not on file  Occupational History  . Not on file  Social Needs  . Financial resource strain: Not on file  . Food insecurity:    Worry: Not on file    Inability: Not on file  . Transportation needs:    Medical: Not on file    Non-medical: Not on file  Tobacco Use  . Smoking status: Former Smoker    Types: Cigarettes    Last attempt to quit: 11/29/2016    Years since quitting: 1.4  . Smokeless tobacco: Never Used  Substance and Sexual Activity  . Alcohol use: Yes  . Drug use: No  . Sexual activity: Not on file  Lifestyle  . Physical activity:    Days per week: Not on file    Minutes per session: Not on file  . Stress: Not on file  Relationships  . Social connections:    Talks on phone: Not on file    Gets together: Not on file    Attends religious service: Not on file  Active member of club or organization: Not on file    Attends meetings of clubs or organizations: Not on file    Relationship status: Not on file  . Intimate partner violence:    Fear of current or ex partner: Not on file    Emotionally abused: Not on file    Physically abused: Not on file    Forced sexual activity: Not on file  Other Topics Concern  . Not on file  Social History Narrative  . Not on file     Review of Systems  Constitutional: Negative.  Negative for activity change, appetite change, chills, diaphoresis, fatigue, fever and weight loss.  HENT: Negative.   Eyes: Negative.   Respiratory: Negative.   Cardiovascular: Negative.   Gastrointestinal: Negative.   Negative for abdominal pain, bowel incontinence, constipation, diarrhea and nausea.  Endocrine: Negative.   Genitourinary: Negative.  Negative for bladder incontinence, dysuria and pelvic pain.  Musculoskeletal: Positive for back pain. Negative for neck pain and neck stiffness.  Skin: Negative.  Negative for color change.  Allergic/Immunologic: Negative for immunocompromised state.  Neurological: Negative.  Negative for tingling, tremors, weakness, numbness, headaches and paresthesias.  Hematological: Negative.   All other systems reviewed and are negative.      Objective:    Vitals:   05/08/18 1435  BP: 120/70  Pulse: 71  Resp: 16  Temp: 97.9 F (36.6 C)  TempSrc: Oral  SpO2: 96%  Weight: 190 lb 4 oz (86.3 kg)      Physical Exam  Constitutional: He appears well-developed and well-nourished. No distress.  HENT:  Head: Normocephalic and atraumatic.  Nose: Nose normal.  Eyes: Conjunctivae are normal. Right eye exhibits no discharge. Left eye exhibits no discharge.  Neck: No tracheal deviation present.  Cardiovascular: Normal rate and regular rhythm.  Pulmonary/Chest: Effort normal. No stridor. No respiratory distress.  Musculoskeletal: He exhibits no edema, tenderness or deformity.  No midline tenderness cervical to lumbar spine, no step-off, no paraspinal muscle tenderness from cervical to lumbar spine, no SI tenderness to palpation bilaterally Normal rotation of spine to the right and to the left, normal extension of the spine and hips, decreased flexion of the spine.   Normal gait Difficulty with going from sitting to standing Normal sensation to light touch in bilateral lower extremities 5 out of 5 strength bilaterally with dorsiflexion and plantarflexion Negative straight leg raise bilaterally In the seated position normal flexion of bilateral hips Symmetrical pulses to bilateral lower extremities 2+ posterior tibialis  Neurological: He is alert. He exhibits normal  muscle tone. Coordination normal.  Skin: Skin is warm and dry. No rash noted. He is not diaphoretic.  Psychiatric: He has a normal mood and affect. His behavior is normal.  Nursing note and vitals reviewed.         Assessment & Plan:      ICD-10-CM   1. Back strain, initial encounter S39.012A cyclobenzaprine (FLEXERIL) 10 MG tablet    Ambulatory referral to Physical Therapy   T12-S1, midline    32 year old male presents with gradual onset of central mid back pain that began after doing dead lifts.  He denies any sudden strain or popping.  Pain gradually began on the second set of deadlifts and has gradually worsened for 7 days.  He points to low T11-S1 or 2 as location of pain, not reproducible on exam with palpation of spinal processes or paraspinal muscles, pain is reproducible with flexion of spine, otherwise normal range of motion and he  does have normal pulses, sensation and strength in his bilateral lower extremities, normal gait once upright and walking.  No red flags concerning for cauda equina.  He endorses some radiation of pain down his right leg but he does not have any lower sacral spine or SI joint back pain and he has a negative straight leg raise so unclear how radiation of pain in his right leg is related to his mid central back pain.    We will start treatment with heat therapy, NSAIDs, muscle relaxers as needed for any muscle spasms or stiffness, no lifting over 10 pounds, limit bending forward, advised to wear a back brace.    Printed a prednisone burst for him to hold only fill prescription and start taking if he has persistent radiation of pain from his back down to his leg with any numbness or tingling.  He was advised not to take prednisone at the same time as NSAIDs.    Physical therapy referral initiated, patient to follow-up in roughly 1 month.   Reviewed concerning signs and symptoms for which she should go to the emergency room for evaluation, patient verbalized  understanding.  Currently do not feel imaging is indicated with mechanism of injury and onset of pain.   Danelle Berry, PA-C 05/08/18 8:16 PM

## 2018-06-11 DIAGNOSIS — J029 Acute pharyngitis, unspecified: Secondary | ICD-10-CM | POA: Diagnosis not present

## 2018-06-12 ENCOUNTER — Ambulatory Visit: Payer: 59 | Admitting: Family Medicine

## 2018-07-25 DIAGNOSIS — Z23 Encounter for immunization: Secondary | ICD-10-CM | POA: Diagnosis not present
